# Patient Record
Sex: Female | Born: 1995 | Hispanic: Yes | Marital: Single | Smoking: Never smoker
Health system: Southern US, Community
[De-identification: ages and names within clinical notes are randomized; demographics above are authoritative.]

## PROBLEM LIST (undated history)

## (undated) HISTORY — PX: WISDOM TOOTH EXTRACTION: SHX21

---
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
## 2016-02-14 ENCOUNTER — Emergency Department (HOSPITAL_BASED_OUTPATIENT_CLINIC_OR_DEPARTMENT_OTHER)
Admission: EM | Admit: 2016-02-14 | Discharge: 2016-02-14 | Disposition: A | Discharge status: Home / Self Care | Payer: No Typology Code available for payment source | Attending: Emergency Medicine | Admitting: Emergency Medicine

## 2016-02-14 ENCOUNTER — Encounter (HOSPITAL_BASED_OUTPATIENT_CLINIC_OR_DEPARTMENT_OTHER): Payer: Self-pay | Admitting: *Deleted

## 2016-02-14 DIAGNOSIS — Y9234 Swimming pool (public) as the place of occurrence of the external cause: Secondary | ICD-10-CM | POA: Insufficient documentation

## 2016-02-14 DIAGNOSIS — Y999 Unspecified external cause status: Secondary | ICD-10-CM | POA: Diagnosis not present

## 2016-02-14 DIAGNOSIS — S8992XA Unspecified injury of left lower leg, initial encounter: Secondary | ICD-10-CM | POA: Diagnosis present

## 2016-02-14 DIAGNOSIS — Y939 Activity, unspecified: Secondary | ICD-10-CM | POA: Diagnosis not present

## 2016-02-14 DIAGNOSIS — W16032A Fall into swimming pool striking wall causing other injury, initial encounter: Secondary | ICD-10-CM | POA: Insufficient documentation

## 2016-02-14 DIAGNOSIS — W19XXXA Unspecified fall, initial encounter: Secondary | ICD-10-CM

## 2016-02-14 DIAGNOSIS — S301XXA Contusion of abdominal wall, initial encounter: Secondary | ICD-10-CM | POA: Insufficient documentation

## 2016-02-14 LAB — URINALYSIS, ROUTINE W REFLEX MICROSCOPIC
Bilirubin Urine: NEGATIVE
Glucose, UA: NEGATIVE mg/dL
Hgb urine dipstick: NEGATIVE
Ketones, ur: NEGATIVE mg/dL
NITRITE: NEGATIVE
Protein, ur: NEGATIVE mg/dL
SPECIFIC GRAVITY, URINE: 1.027 (ref 1.005–1.030)
pH: 7 (ref 5.0–8.0)

## 2016-02-14 LAB — URINE MICROSCOPIC-ADD ON

## 2016-02-14 LAB — PREGNANCY, URINE: Preg Test, Ur: NEGATIVE

## 2016-02-14 MED ORDER — IBUPROFEN 600 MG PO TABS: 600.0000 mg | ORAL_TABLET | Freq: Four times a day (QID) | ORAL | Status: AC | PRN

## 2016-02-14 NOTE — ED Notes (Signed)
She slipped and fell a week ago. Injury to her left upper leg/pelvis. Pain is no better and now she has a knot.

## 2016-02-14 NOTE — ED Provider Notes (Signed)
CSN: [id number]     Arrival date & time 02/14/16  1949 History  By signing my name below, I, Nancy Roberts, attest that this documentation has been prepared under the direction and in the presence of non-physician practitioner, Melburn Hake, PA-C Electronically Signed: Levon Roberts, Scribe. 02/14/2016. 8:29 PM.   Chief Complaint  Patient presents with  . Leg Injury    The history is provided by the patient. No language interpreter was used.   HPI Comments: Nancy Roberts is a 20 y.o. female with no pertinent PMHx who presents to the Emergency Department complaining of sudden onset, gradually improving left leg pain s/p fall 1.5 weeks ago. Pt states she slipped and fell into a pool, hitting her left groin area on the edge of the pool. Pt denies head injury or LOC. She states the pain has improved, but there is still a small area of swelling, bruising and a "little ball" which caused her concern. She has not taken any medication for pain. Pt denies any numbness, dysuria, vaginal bleeding, abdominal pain, vomiting, urinary sxs or weakness.   History reviewed. No pertinent past medical history. History reviewed. No pertinent past surgical history. No family history on file. Social History  Substance Use Topics  . Smoking status: Never Smoker   . Smokeless tobacco: None  . Alcohol Use: No   OB History    No data available     Review of Systems  Constitutional: Negative for fever.  Gastrointestinal: Negative for abdominal pain.  Genitourinary: Negative for dysuria and vaginal bleeding.  Musculoskeletal: Positive for arthralgias.  Skin:       Bruising  Neurological: Negative for syncope, weakness and numbness.    Allergies  Ibuprofen  Home Medications   Prior to Admission medications   Medication Sig Start Date End Date Taking? Authorizing Provider  ibuprofen (ADVIL,MOTRIN) 600 MG tablet Take 1 tablet (600 mg total) by mouth every 6 (six) hours as needed. 02/14/16   Satira Sark Nadeau, PA-C   BP 128/83 mmHg  Pulse 79  Temp(Src) 98.2 F (36.8 C) (Oral)  Resp 18  Ht 5' 3.5" (1.613 m)  Wt 58.06 kg  BMI 22.32 kg/m2  SpO2 99%  LMP 01/31/2016 Physical Exam  Constitutional: She is oriented to person, place, and time. She appears well-developed and well-nourished.  HENT:  Head: Normocephalic and atraumatic.  Eyes: Conjunctivae and EOM are normal. Right eye exhibits no discharge. Left eye exhibits no discharge. No scleral icterus.  Neck: Normal range of motion. Neck supple.  Cardiovascular: Normal rate.   Pulmonary/Chest: Effort normal.  Musculoskeletal:  Small amount of ecchymosis and swelling noted to left groin and mons pubis area. 0.5 cm mobile mass noted to left inguinal area, tender to palpation, no surrounding erythema, warmth or drainage, lesion consistent with inflammed lymph node. FROM of left hip, knee, ankle, and foot. Sensation grossly intact. 2+ PT pulses. Pt able to stand and ambulate without assistance. No hip tenderness or pelvic instability noted.   Neurological: She is alert and oriented to person, place, and time.  Nursing note and vitals reviewed.   ED Course  Procedures  DIAGNOSTIC STUDIES:  Oxygen Saturation is 99% on RA, normal by my interpretation.    COORDINATION OF CARE:  8:20 PM Discussed treatment plan with pt at bedside and pt agreed to plan.  Labs Review Labs Reviewed  URINALYSIS, ROUTINE W REFLEX MICROSCOPIC (NOT AT [facility]) - Abnormal; Notable for the following:    Leukocytes, UA SMALL (*)  All other components within normal limits  URINE MICROSCOPIC-ADD ON - Abnormal; Notable for the following:    Squamous Epithelial / LPF 0-5 (*)    Bacteria, UA MANY (*)    All other components within normal limits  PREGNANCY, URINE    Imaging Review No results found. I have personally reviewed and evaluated these images and lab results as part of my medical decision-making.   EKG Interpretation None     MDM    Final diagnoses:  Fall, initial encounter    Patient presents status post fall that occurred 1.5 weeks ago with reported left groin and upper leg pain. Denies head injury or LOC. She notes her pain, swelling and bruising have improved over the week. VSS. Exam revealed small area of swelling and bruising noted to left groin small palpable lymph node in left inguinal region. No signs of abscess or cellulitis. Full range of motion of left hip and lower extremity, left lower extremity neurovascularly intact. Patient able to stand and ambulate. I suspect patient's symptoms are likely due to contusion associated with recent fall and do not feel any further workup or imaging is warranted at this time. Plan to discharge patient home with NSAIDs and symptomatic treatment. Advised patient to follow up with PCP as needed. Discussed return precautions.  I personally performed the services described in this documentation, which was scribed in my presence. The recorded information has been reviewed and is accurate.    Satira Sarkicole Elizabeth FairportNadeau, [place] JerseyPA-C 02/14/16 2040  Lyndal Pulleyaniel Knott, MD 02/16/16 [phone]

## 2016-02-14 NOTE — Discharge Instructions (Signed)
Take ibuprofen as prescribed as needed for pain relief. I also recommend resting and applying ice to affected area for 15-20 minutes 3-4 times daily to help with pain and swelling. Please follow up with a primary care provider from the Resource Guide provided below in 1 week as needed if your symptoms have not improved. Please return to the Emergency Department if symptoms worsen or new onset of fever, abdominal pain, pain/difficulty urinating, vomiting, blood in urine, numbness, tingling, weakness.

## 2016-08-18 ENCOUNTER — Encounter (HOSPITAL_BASED_OUTPATIENT_CLINIC_OR_DEPARTMENT_OTHER): Payer: Self-pay | Admitting: *Deleted

## 2016-08-18 ENCOUNTER — Emergency Department (HOSPITAL_BASED_OUTPATIENT_CLINIC_OR_DEPARTMENT_OTHER)
Admission: EM | Admit: 2016-08-18 | Discharge: 2016-08-18 | Disposition: A | Discharge status: Home / Self Care | Payer: No Typology Code available for payment source | Attending: Emergency Medicine | Admitting: Emergency Medicine

## 2016-08-18 DIAGNOSIS — Z79899 Other long term (current) drug therapy: Secondary | ICD-10-CM | POA: Insufficient documentation

## 2016-08-18 DIAGNOSIS — N3000 Acute cystitis without hematuria: Secondary | ICD-10-CM | POA: Insufficient documentation

## 2016-08-18 DIAGNOSIS — Z792 Long term (current) use of antibiotics: Secondary | ICD-10-CM | POA: Insufficient documentation

## 2016-08-18 DIAGNOSIS — R1084 Generalized abdominal pain: Secondary | ICD-10-CM

## 2016-08-18 DIAGNOSIS — R109 Unspecified abdominal pain: Secondary | ICD-10-CM | POA: Diagnosis present

## 2016-08-18 LAB — URINALYSIS, ROUTINE W REFLEX MICROSCOPIC
Bilirubin Urine: NEGATIVE
GLUCOSE, UA: NEGATIVE mg/dL
Hgb urine dipstick: NEGATIVE
KETONES UR: NEGATIVE mg/dL
Nitrite: NEGATIVE
PH: 7.5 (ref 5.0–8.0)
Protein, ur: NEGATIVE mg/dL
Specific Gravity, Urine: 1.021 (ref 1.005–1.030)

## 2016-08-18 LAB — PREGNANCY, URINE: Preg Test, Ur: NEGATIVE

## 2016-08-18 LAB — URINALYSIS, MICROSCOPIC (REFLEX)

## 2016-08-18 MED ORDER — POLYETHYLENE GLYCOL 3350 17 G PO PACK: 17.0000 g | PACK | Freq: Every day | ORAL | 0 refills | Status: AC

## 2016-08-18 MED ORDER — CEPHALEXIN 500 MG PO CAPS: 500.0000 mg | ORAL_CAPSULE | Freq: Four times a day (QID) | ORAL | 0 refills | Status: DC

## 2016-08-18 MED FILL — POLYETHYLENE GLYCOL 3350: 14 days supply | Qty: 255 | Fill #0

## 2016-08-18 MED FILL — CEPHALEXIN 500 MG CAPSULE: 500 | 10 days supply | Qty: 40 | Fill #0

## 2016-08-18 NOTE — Discharge Instructions (Signed)
Stop hydrocodone and penicillin.   Take keflex which should cover teeth and urinary tract infection.  Miralax for constipation.  Return if any fever or increased pain

## 2016-08-18 NOTE — ED Triage Notes (Signed)
Right upper quadrant pain for a week.

## 2016-08-18 NOTE — ED Provider Notes (Signed)
[facility]   CSN: [id number] Arrival date & time: 08/18/16  1309     History   Chief Complaint Chief Complaint  Patient presents with  . Abdominal Pain    HPI Nancy Roberts is a 21 y.o. female.  The history is provided by the patient. No language interpreter was used.  Abdominal Pain   This is a new problem. The problem occurs constantly. The problem has been gradually worsening. The pain is moderate. Pertinent negatives include anorexia. Nothing aggravates the symptoms. Nothing relieves the symptoms. Her past medical history does not include GERD.  Pt complains of soreness in her right side for several days.  [facility] and was told to get checked by RN. Pt has been taking pcn and hydrocodone for tooth problem.  Pt has had some constipation History reviewed. No pertinent past medical history.  There are no active problems to display for this patient.   Past Surgical History:  Procedure Laterality Date  . WISDOM TOOTH EXTRACTION      OB History    No data available       Home Medications    Prior to Admission medications   Medication Sig Start Date End Date Taking? Authorizing Provider  PENICILLIN V POTASSIUM PO Take by mouth.   Yes Historical Provider, MD  cephALEXin (KEFLEX) 500 MG capsule Take 1 capsule (500 mg total) by mouth 4 (four) times daily. 08/18/16   Elson AreasLeslie K Prajwal Fellner, PA-C  ibuprofen (ADVIL,MOTRIN) 600 MG tablet Take 1 tablet (600 mg total) by mouth every 6 (six) hours as needed. 02/14/16   Barrett HenleNicole Elizabeth Nadeau, PA-C  polyethylene glycol [facility](MIRALAX) packet Take 17 g by mouth daily. 08/18/16   Elson AreasLeslie K Kosha Jaquith, PA-C    Family History No family history on file.  Social History Social History  Substance Use Topics  . Smoking status: Never Smoker  . Smokeless tobacco: Never Used  . Alcohol use No     Allergies   Ibuprofen   Review of Systems Review of Systems  Gastrointestinal: Positive for abdominal pain.  Negative for anorexia.  All other systems reviewed and are negative.    Physical Exam Updated Vital Signs BP 104/58   Pulse 76   Temp 97.9 F (36.6 C) (Oral)   Resp 16   Ht 5\' 3"  (1.6 m)   Wt 57.2 kg   LMP 08/11/2016   SpO2 100%   BMI 22.32 kg/m   Physical Exam  Constitutional: She is oriented to person, place, and time. She appears well-developed and well-nourished.  HENT:  Head: Normocephalic.  Right Ear: External ear normal.  Left Ear: External ear normal.  Nose: Nose normal.  Mouth/Throat: Oropharynx is clear and moist.  Eyes: Conjunctivae and EOM are normal. Pupils are equal, round, and reactive to light.  Neck: Normal range of motion.  Cardiovascular: Normal rate and regular rhythm.   Pulmonary/Chest: Effort normal.  Abdominal: Soft. There is no tenderness. There is no guarding.  Musculoskeletal: Normal range of motion.  Neurological: She is alert and oriented to person, place, and time.  Skin: Skin is warm.  Psychiatric: She has a normal mood and affect.  Nursing note and vitals reviewed.    ED Treatments / Results  Labs (all labs ordered are listed, but only abnormal results are displayed) Labs Reviewed  URINALYSIS, ROUTINE W REFLEX MICROSCOPIC - Abnormal; Notable for the following:       Result Value   Leukocytes, UA TRACE (*)  All other components within normal limits  URINALYSIS, MICROSCOPIC (REFLEX) - Abnormal; Notable for the following:    Bacteria, UA MANY (*)    Squamous Epithelial / LPF 0-5 (*)    All other components within normal limits  PREGNANCY, URINE    EKG  EKG Interpretation None       Radiology No results found.  Procedures Procedures (including critical care time)  Medications Ordered in ED Medications - No data to display   Initial Impression / Assessment and Plan / ED Course  I have reviewed the triage vital signs and the nursing notes.  Pertinent labs & imaging results that were available during my care of the  patient were reviewed by me and considered in my medical decision making (see chart for details).  Clinical Course     Pt's urine shows many bacteria.  No abdominal tenderness.  I doubt abdominal pathology.  I will have pt stop hydrocodone and pcn.   Pt given rx for keflex and miralax.    Final Clinical Impressions(s) / ED Diagnoses   Final diagnoses:  Acute cystitis without hematuria  Generalized abdominal pain    New Prescriptions Discharge Medication List as of 08/18/2016  2:06 PM    START taking these medications   Details  cephALEXin (KEFLEX) 500 MG capsule Take 1 capsule (500 mg total) by mouth 4 (four) times daily., Starting Mon 08/18/2016, Print    polyethylene glycol (MIRALAX) packet Take 17 g by mouth daily., Starting Mon 08/18/2016, Print         Lonia Skinner Kermit, PA-C 08/18/16 1513    Rolan Bucco, MD 08/18/16 [phone]

## 2016-12-23 ENCOUNTER — Emergency Department (HOSPITAL_BASED_OUTPATIENT_CLINIC_OR_DEPARTMENT_OTHER)
Admission: EM | Admit: 2016-12-23 | Discharge: 2016-12-23 | Disposition: A | Discharge status: Home / Self Care | Payer: No Typology Code available for payment source | Attending: Emergency Medicine | Admitting: Emergency Medicine

## 2016-12-23 ENCOUNTER — Encounter (HOSPITAL_BASED_OUTPATIENT_CLINIC_OR_DEPARTMENT_OTHER): Payer: Self-pay | Admitting: Emergency Medicine

## 2016-12-23 DIAGNOSIS — N342 Other urethritis: Secondary | ICD-10-CM | POA: Diagnosis not present

## 2016-12-23 DIAGNOSIS — R319 Hematuria, unspecified: Secondary | ICD-10-CM | POA: Diagnosis present

## 2016-12-23 LAB — URINALYSIS, MICROSCOPIC (REFLEX)

## 2016-12-23 LAB — URINALYSIS, ROUTINE W REFLEX MICROSCOPIC
BILIRUBIN URINE: NEGATIVE
GLUCOSE, UA: NEGATIVE mg/dL
Ketones, ur: NEGATIVE mg/dL
Nitrite: NEGATIVE
Protein, ur: NEGATIVE mg/dL
SPECIFIC GRAVITY, URINE: 1.022 (ref 1.005–1.030)
pH: 7 (ref 5.0–8.0)

## 2016-12-23 LAB — PREGNANCY, URINE: PREG TEST UR: NEGATIVE

## 2016-12-23 MED ORDER — CEPHALEXIN 250 MG/5ML PO SUSR: 500.0000 mg | Freq: Three times a day (TID) | ORAL | 0 refills | Status: AC

## 2016-12-23 MED FILL — CEPHALEXIN 250 MG/5 ML SUSP: 250 | 5 days supply | Qty: 200 | Fill #0

## 2016-12-23 NOTE — ED Provider Notes (Signed)
[facility]   CSN: [id number] Arrival date & time: 12/23/16  1257     History   Chief Complaint Chief Complaint  Patient presents with  . Hematuria    HPI Nancy Roberts is a 21 y.o. female hx of UTI here with hematuria, dysuria. Patient states that she has dysuria and hematuria since yesterday. Some suprapubic pressure. Denies flank pain or fever or vomiting. Had similar symptoms in Jan and treated with keflex and symptoms resolved. Patient has vaginal itchiness but denies discharge. Denies hx of STDs.   The history is provided by the patient.    History reviewed. No pertinent past medical history.  There are no active problems to display for this patient.   Past Surgical History:  Procedure Laterality Date  . WISDOM TOOTH EXTRACTION      OB History    No data available       Home Medications    Prior to Admission medications   Medication Sig Start Date End Date Taking? Authorizing Provider  cephALEXin (KEFLEX) 500 MG capsule Take 1 capsule (500 mg total) by mouth 4 (four) times daily. 08/18/16   Elson AreasSofia, Leslie K, PA-C  ibuprofen (ADVIL,MOTRIN) 600 MG tablet Take 1 tablet (600 mg total) by mouth every 6 (six) hours as needed. 02/14/16   Barrett HenleNadeau, Nicole Elizabeth, PA-C  PENICILLIN V POTASSIUM PO Take by mouth.    [provider]  polyethylene glycol (MIRALAX) packet Take 17 g by mouth daily. 08/18/16   Elson AreasSofia, Leslie K, PA-C    Family History No family history on file.  Social History Social History  Substance Use Topics  . Smoking status: Never Smoker  . Smokeless tobacco: Never Used  . Alcohol use No     Allergies   Ibuprofen   Review of Systems Review of Systems  Genitourinary: Positive for dysuria and hematuria.  All other systems reviewed and are negative.    Physical Exam Updated Vital Signs BP 103/63 (BP Location: Right Arm)   Pulse 72   Temp 98.4 F (36.9 C) (Oral)   Resp 12   Ht 5\' 4"  (1.626 m)   Wt  56.7 kg (125 lb)   LMP 12/18/2016   SpO2 100%   BMI 21.46 kg/m   Physical Exam  Constitutional: She is oriented to person, place, and time. She appears well-developed.  HENT:  Head: Normocephalic.  Mouth/Throat: Oropharynx is clear and moist.  Eyes: EOM are normal. Pupils are equal, round, and reactive to light.  Neck: Normal range of motion.  Cardiovascular: Normal rate, regular rhythm and normal heart sounds.   Pulmonary/Chest: Effort normal and breath sounds normal. No respiratory distress. She has no wheezes.  Abdominal: Soft. Bowel sounds are normal.  Mild suprapubic tenderness, no CVAT   Musculoskeletal: Normal range of motion.  Neurological: She is alert and oriented to person, place, and time. No cranial nerve deficit.  Skin: Skin is warm.  Psychiatric: She has a normal mood and affect.  Nursing note and vitals reviewed.    ED Treatments / Results  Labs (all labs ordered are listed, but only abnormal results are displayed) Labs Reviewed  URINALYSIS, ROUTINE W REFLEX MICROSCOPIC - Abnormal; Notable for the following:       Result Value   APPearance CLOUDY (*)    Hgb urine dipstick TRACE (*)    Leukocytes, UA MODERATE (*)    All other components within normal limits  URINALYSIS, MICROSCOPIC (REFLEX) - Abnormal; Notable for the following:  Bacteria, UA MANY (*)    Squamous Epithelial / LPF 6-30 (*)    All other components within normal limits  URINE CULTURE  PREGNANCY, URINE    EKG  EKG Interpretation None       Radiology No results found.  Procedures Procedures (including critical care time)  Medications Ordered in ED Medications - No data to display   Initial Impression / Assessment and Plan / ED Course  I have reviewed the triage vital signs and the nursing notes.  Pertinent labs & imaging results that were available during my care of the patient were reviewed by me and considered in my medical decision making (see chart for details).      Nancy Roberts is a 21 y.o. female here with dysuria, hematuria. No vaginal discharge. UA + UTI. I think likely lower UTI. Patient wants to defer pelvic currently. UCG neg. Sent off urine culture. Will dc home with keflex.    Final Clinical Impressions(s) / ED Diagnoses   Final diagnoses:  None    New Prescriptions New Prescriptions   No medications on file     Charlynne Pander, MD 12/23/16 [phone]

## 2016-12-23 NOTE — Discharge Instructions (Signed)
Take keflex three times daily for 5 days for UTI.   See your doctor  Return to ER if you have trouble urinating, severe pelvic pain, vaginal discharge, flank pain, fever, vomiting.

## 2016-12-23 NOTE — ED Triage Notes (Addendum)
Hematuria, dysuria, vaginal itching x 3 days.

## 2016-12-25 LAB — URINE CULTURE: Culture: >=100000 — AB

## 2016-12-26 ENCOUNTER — Telehealth: Payer: Self-pay

## 2016-12-26 NOTE — Telephone Encounter (Signed)
Post ED Visit - Positive Culture Follow-up  Culture report reviewed by antimicrobial stewardship pharmacist:  []  Enzo BiNathan Batchelder, Pharm.D. [x]  Celedonio MiyamotoJeremy Frens, Pharm.D., BCPS AQ-ID []  Garvin FilaMike Maccia, Pharm.D., BCPS []  Georgina PillionElizabeth Martin, Pharm.D., BCPS []  FleischmannsMinh Pham, VermontPharm.D., BCPS, AAHIVP []  Estella HuskMichelle Turner, Pharm.D., BCPS, AAHIVP []  Lysle Pearlachel Rumbarger, PharmD, BCPS []  Casilda Carlsaylor Stone, PharmD, BCPS []  Pollyann SamplesAndy Johnston, PharmD, BCPS  Positive urine culture Treated with Cephalexin, organism sensitive to the same and no further patient follow-up is required at this time.  Jerry CarasCullom, Taneil Lazarus Burnett 12/26/2016, 9:26 AM

## 2017-01-28 ENCOUNTER — Emergency Department (HOSPITAL_BASED_OUTPATIENT_CLINIC_OR_DEPARTMENT_OTHER)
Admission: EM | Admit: 2017-01-28 | Discharge: 2017-01-28 | Disposition: A | Discharge status: Home / Self Care | Payer: Medicaid Other | Attending: Emergency Medicine | Admitting: Emergency Medicine

## 2017-01-28 ENCOUNTER — Encounter (HOSPITAL_BASED_OUTPATIENT_CLINIC_OR_DEPARTMENT_OTHER): Payer: Self-pay | Admitting: Emergency Medicine

## 2017-01-28 DIAGNOSIS — N898 Other specified noninflammatory disorders of vagina: Secondary | ICD-10-CM | POA: Diagnosis present

## 2017-01-28 DIAGNOSIS — N760 Acute vaginitis: Secondary | ICD-10-CM | POA: Insufficient documentation

## 2017-01-28 DIAGNOSIS — B9689 Other specified bacterial agents as the cause of diseases classified elsewhere: Secondary | ICD-10-CM

## 2017-01-28 DIAGNOSIS — Z79899 Other long term (current) drug therapy: Secondary | ICD-10-CM | POA: Diagnosis not present

## 2017-01-28 LAB — URINALYSIS, ROUTINE W REFLEX MICROSCOPIC
BILIRUBIN URINE: NEGATIVE
Glucose, UA: NEGATIVE mg/dL
Hgb urine dipstick: NEGATIVE
Ketones, ur: NEGATIVE mg/dL
LEUKOCYTES UA: NEGATIVE
NITRITE: NEGATIVE
PH: 6.5 (ref 5.0–8.0)
Protein, ur: NEGATIVE mg/dL
SPECIFIC GRAVITY, URINE: 1.022 (ref 1.005–1.030)

## 2017-01-28 LAB — WET PREP, GENITAL
SPERM: NONE SEEN
TRICH WET PREP: NONE SEEN
YEAST WET PREP: NONE SEEN

## 2017-01-28 LAB — PREGNANCY, URINE: Preg Test, Ur: NEGATIVE

## 2017-01-28 MED ORDER — METRONIDAZOLE 500 MG PO TABS
500.0000 mg | ORAL_TABLET | Freq: Two times a day (BID) | ORAL | 0 refills | Status: AC
Start: 2017-01-28 — End: 2017-02-04

## 2017-01-28 MED ORDER — METRONIDAZOLE 50 MG/ML ORAL SUSPENSION: 500.0000 mg | Freq: Two times a day (BID) | ORAL | 0 refills | Status: AC

## 2017-01-28 MED FILL — metroNIDAZOLE 500 MG TABS: 500 | 7 days supply | Qty: 14 | Fill #0

## 2017-01-28 NOTE — ED Triage Notes (Signed)
Pt c/o vaginal discharge x 2 days 

## 2017-01-28 NOTE — Discharge Instructions (Signed)
Please take the antibiotics to treat your bacterial vaginosis. Please follow-up with a PCP. If any symptoms change or worsen, please return to the nearest emergency department

## 2017-01-28 NOTE — ED Notes (Signed)
[facility].

## 2017-01-28 NOTE — ED Provider Notes (Signed)
[facility] Provider Note   CSN: [id number] Arrival date & time: 01/28/17  1343     History   Chief Complaint Chief Complaint  Patient presents with  . Vaginal Discharge    HPI Nancy Roberts is a 21 y.o. female.  The history is provided by the patient. No language interpreter was used.  Vaginal Discharge   This is a recurrent problem. The current episode started 2 days ago. The problem occurs constantly. The problem has not changed since onset.The discharge was white and malodorous. She has not missed her period. Associated symptoms include genital burning and perineal odor. Pertinent negatives include no anorexia, no fever, no abdominal pain, no constipation, no diarrhea, no nausea, no vomiting, no dysuria, no frequency, no genital lesions and no perineal pain. She has tried nothing for the symptoms. The treatment provided no relief. Her past medical history does not include irregular periods.    History reviewed. No pertinent past medical history.  There are no active problems to display for this patient.   Past Surgical History:  Procedure Laterality Date  . WISDOM TOOTH EXTRACTION      OB History    No data available       Home Medications    Prior to Admission medications   Medication Sig Start Date End Date Taking? Authorizing Provider  ibuprofen (ADVIL,MOTRIN) 600 MG tablet Take 1 tablet (600 mg total) by mouth every 6 (six) hours as needed. 02/14/16   Barrett Henle, PA-C  polyethylene glycol [facility]) packet Take 17 g by mouth daily. 08/18/16   Elson Areas, PA-C    Family History History reviewed. No pertinent family history.  Social History Social History  Substance Use Topics  . Smoking status: Never Smoker  . Smokeless tobacco: Never Used  . Alcohol use No     Allergies   Ibuprofen   Review of Systems Review of Systems  Constitutional: Negative for chills, fatigue and fever.  HENT: Negative for congestion and  rhinorrhea.   Respiratory: Negative for cough and shortness of breath.   Cardiovascular: Negative for chest pain.  Gastrointestinal: Negative for abdominal pain, anorexia, constipation, diarrhea, nausea and vomiting.  Genitourinary: Positive for vaginal discharge. Negative for decreased urine volume, dysuria, flank pain, frequency, hematuria, menstrual problem, pelvic pain, urgency, vaginal bleeding and vaginal pain.  Musculoskeletal: Negative for back pain and neck pain.  Skin: Negative for rash and wound.  Neurological: Negative for weakness, light-headedness, numbness and headaches.  All other systems reviewed and are negative.    Physical Exam Updated Vital Signs BP 115/81   Pulse 80   Temp 98.5 F (36.9 C) (Oral)   Resp 16   Ht 5\' 4"  (1.626 m)   Wt 57.2 kg (126 lb)   LMP 01/23/2017   SpO2 97%   BMI 21.63 kg/m   Physical Exam  Constitutional: She appears well-developed and well-nourished. No distress.  HENT:  Head: Normocephalic and atraumatic.  Mouth/Throat: Oropharynx is clear and moist. No oropharyngeal exudate.  Eyes: Conjunctivae and EOM are normal. Pupils are equal, round, and reactive to light.  Neck: Normal range of motion. Neck supple.  Cardiovascular: Normal rate and regular rhythm.   No murmur heard. Pulmonary/Chest: Effort normal and breath sounds normal. No stridor.  Abdominal: Soft. There is no tenderness.  Genitourinary: Pelvic exam was performed with patient supine. There is no tenderness on the right labia. There is no tenderness on the left labia. Uterus is not tender. Cervix exhibits discharge. Cervix exhibits  no motion tenderness and no friability. Right adnexum displays no tenderness. Left adnexum displays no tenderness. No erythema, tenderness or bleeding in the vagina. No foreign body in the vagina. Vaginal discharge found.  Genitourinary Comments: Discharge present.   Musculoskeletal: She exhibits no edema or tenderness.  Neurological: She is alert.  No sensory deficit. She exhibits normal muscle tone.  Skin: Skin is warm and dry. Capillary refill takes less than 2 seconds. No rash noted. She is not diaphoretic. No erythema.  Nursing note and vitals reviewed.    ED Treatments / Results  Labs (all labs ordered are listed, but only abnormal results are displayed) Labs Reviewed  WET PREP, GENITAL - Abnormal; Notable for the following:       Result Value   Clue Cells Wet Prep HPF POC PRESENT (*)    WBC, Wet Prep HPF POC MANY (*)    All other components within normal limits  URINALYSIS, ROUTINE W REFLEX MICROSCOPIC  PREGNANCY, URINE  GC/CHLAMYDIA PROBE AMP ([place]    EKG  EKG Interpretation None       Radiology No results found.  Procedures Procedures (including critical care time)  Medications Ordered in ED Medications - No data to display   Initial Impression / Assessment and Plan / ED Course  I have reviewed the triage vital signs and the nursing notes.  Pertinent labs & imaging results that were available during my care of the patient were reviewed by me and considered in my medical decision making (see chart for details).     Nancy Roberts is a [age] y.o. female with a past medical history significant for bacterial vaginosis who presents with vaginal burning and vaginal discharge. Patient reports that she is sexually active. She says that she had BV several months ago and was treated with antibiotics. She says this seems similar. She denies any abdominal pain or abdominal cramping. She reports her last initial cycle was 2 weeks ago. She describes a white malodorous discharge and burning in her vagina. She denies any somatic injuries. She denies any dysuria but says she has history of UTI as well. She denies other complaints.  History and exam are seen above. On exam, abdomen nontender. No CVA tenderness. Lungs clear.   Patient will have pelvic exam including swabs for wet prep and STI. Patient will  have urinalysis to look for UTI given the burning.  Anticipate reassessment following workup.  Diagnostic testing reveals evidence of bacterial vaginosis. Patient will be given prescription for Flagyl. No evidence of UTI or other abnormality.  Patient discharged in good condition with prescription. Patient will follow up with PCP for further results of swabs. Patient discharged.    Final Clinical Impressions(s) / ED Diagnoses   Final diagnoses:  BV (bacterial vaginosis)  Vaginal discharge     Clinical Impression: 1. BV (bacterial vaginosis)   2. Vaginal discharge     Disposition: Discharge  Condition: Good  I have discussed the results, Dx and Tx plan with the pt(& family if present). He/she/they expressed understanding and agree(s) with the plan. Discharge instructions discussed at great length. Strict return precautions discussed and pt &/or family have verbalized understanding of the instructions. No further questions at time of discharge.    Discharge Medication List as of 01/28/2017  3:34 PM    START taking these medications   Details  metroNIDAZOLE (FLAGYL) 50 mg/ml oral suspension Take 10 mLs (500 mg total) by mouth 2 (two) times daily., Starting Wed 01/28/2017,  Until Wed 02/04/2017, Print        Follow Up: [facility] [address] [place] [address] [phone] Schedule an appointment as soon as possible for a visit    [facility] [place] [facility] [address] [id number] mc [address] [place] [address] [phone]  If symptoms worsen     Tegeler, Canary Brim, MD 01/28/17 1753

## 2017-01-29 LAB — GC/CHLAMYDIA PROBE AMP (~~LOC~~) NOT AT ARMC
CHLAMYDIA, DNA PROBE: NEGATIVE
Neisseria Gonorrhea: NEGATIVE
